# Patient Record
Sex: Male | Born: 1970 | Race: White | Hispanic: No | Marital: Married | Smoking: Current every day smoker
Health system: Southern US, Community
[De-identification: ages and names within clinical notes are randomized; demographics above are authoritative.]

## PROBLEM LIST (undated history)

## (undated) DIAGNOSIS — J45909 Unspecified asthma, uncomplicated: Secondary | ICD-10-CM

---

## 2011-01-18 ENCOUNTER — Emergency Department: Payer: Self-pay | Admitting: *Deleted

## 2012-02-29 ENCOUNTER — Ambulatory Visit: Payer: Self-pay

## 2012-08-10 ENCOUNTER — Ambulatory Visit: Payer: Self-pay | Admitting: Internal Medicine

## 2012-08-10 LAB — CBC WITH DIFFERENTIAL/PLATELET
Basophil %: 0.6 %
Eosinophil %: 0 %
HCT: 50 % (ref 40.0–52.0)
Lymphocyte #: 0.4 10*3/uL — ABNORMAL LOW (ref 1.0–3.6)
Lymphocyte %: 10 %
MCHC: 35 g/dL (ref 32.0–36.0)
MCV: 87 fL (ref 80–100)
Monocyte #: 0.3 x10 3/mm (ref 0.2–1.0)
Monocyte %: 8.1 %
RBC: 5.77 10*6/uL (ref 4.40–5.90)
RDW: 12.9 % (ref 11.5–14.5)
WBC: 3.7 10*3/uL — ABNORMAL LOW (ref 3.8–10.6)

## 2012-08-10 LAB — COMPREHENSIVE METABOLIC PANEL
Albumin: 4.1 g/dL (ref 3.4–5.0)
Anion Gap: 8 (ref 7–16)
Calcium, Total: 8.6 mg/dL (ref 8.5–10.1)
Chloride: 99 mmol/L (ref 98–107)
Co2: 28 mmol/L (ref 21–32)
Glucose: 97 mg/dL (ref 65–99)
Osmolality: 269 (ref 275–301)
SGOT(AST): 45 U/L — ABNORMAL HIGH (ref 15–37)
SGPT (ALT): 52 U/L (ref 12–78)
Sodium: 135 mmol/L — ABNORMAL LOW (ref 136–145)

## 2012-08-10 LAB — URINALYSIS, COMPLETE
Bacteria: NEGATIVE
Ketone: NEGATIVE
Leukocyte Esterase: NEGATIVE
Nitrite: NEGATIVE
Ph: 5 (ref 4.5–8.0)
Specific Gravity: 1.025 (ref 1.003–1.030)

## 2012-08-11 LAB — URINE CULTURE

## 2013-01-27 ENCOUNTER — Ambulatory Visit: Payer: Self-pay | Admitting: Physician Assistant

## 2013-03-31 ENCOUNTER — Ambulatory Visit: Payer: Self-pay | Admitting: Physician Assistant

## 2013-03-31 LAB — RAPID INFLUENZA A&B ANTIGENS

## 2013-03-31 LAB — RAPID STREP-A WITH REFLX: MICRO TEXT REPORT: NEGATIVE

## 2013-04-03 LAB — BETA STREP CULTURE(ARMC)

## 2014-02-10 ENCOUNTER — Ambulatory Visit: Payer: Self-pay | Admitting: Family Medicine

## 2014-07-30 ENCOUNTER — Encounter: Payer: Self-pay | Admitting: *Deleted

## 2014-07-30 ENCOUNTER — Emergency Department
Admission: EM | Admit: 2014-07-30 | Discharge: 2014-07-30 | Disposition: A | Discharge status: Home / Self Care | Payer: BLUE CROSS/BLUE SHIELD | Attending: Emergency Medicine | Admitting: Emergency Medicine

## 2014-07-30 ENCOUNTER — Emergency Department: Payer: BLUE CROSS/BLUE SHIELD

## 2014-07-30 DIAGNOSIS — Z720 Tobacco use: Secondary | ICD-10-CM | POA: Insufficient documentation

## 2014-07-30 DIAGNOSIS — H5330 Unspecified disorder of binocular vision: Secondary | ICD-10-CM

## 2014-07-30 DIAGNOSIS — H538 Other visual disturbances: Secondary | ICD-10-CM | POA: Diagnosis present

## 2014-07-30 DIAGNOSIS — G43909 Migraine, unspecified, not intractable, without status migrainosus: Secondary | ICD-10-CM | POA: Diagnosis not present

## 2014-07-30 DIAGNOSIS — G43009 Migraine without aura, not intractable, without status migrainosus: Secondary | ICD-10-CM

## 2014-07-30 NOTE — ED Notes (Signed)
AAOx3.  skikn warm and dry.  NAD.  Verbalized understnading of instructions.

## 2014-07-30 NOTE — ED Notes (Signed)
Pt states that for the last 2 weeks he has had loss of peripheral vision bilaterally. States it starts out as a line in his vision and progresses to loss of peripheral vision. States that he has had a headache (2/10) afterwards most of the time, but not in most recent episode (this am). Pt states that when he coughs or beds over, he has severe pain in his temples. Pt saw optometrist on Tuesday and was told his eyes are fine and to go to ED if it happened again. He has appt with neurologist on 6/15. Pt eating snacks and in NAD. States he is feeling normal ATT.

## 2014-07-30 NOTE — ED Notes (Signed)
Pt advised of wait, verbalized understanding, no distress noted

## 2014-07-30 NOTE — ED Provider Notes (Signed)
[facility] Emergency Department Provider Note ____________________________________________  Time seen: 1520  I have reviewed the triage vital signs and the nursing notes.  HISTORY  Chief Complaint Eye Problem  HPI Jim Bush is a 44 y.o. male who reports to the ED with complaints of intermittent vision change over the last 2 weeks. He describes an increase in the incidence of intermittent loss of peripheral vision over the last 2 weeks, noting recently the incidents have occurred every other day. He states that the visual disturbance last anywhere from 15-30 minutes. He has also a temporal headache with onset after the visual disturbance has resolved. He denies any nausea, vomiting, vertigo, distal paresthesias, or unilateral lateral weakness with his symptoms. He was recently evaluated by his optometrist, reporting a normal eye exam at the time. He was told to report to the ED if his symptoms recurred. He is here for evaluation and management of symptoms that began today while at work. Due to the ED volume today, his symptoms are now resolved at the time of evaluation. Patient describes waviness and blurriness to his peripheral vision bilaterally, without central vision loss. Denies any specific aura or prodrome before the onset, but does experience some head ache pain at the temples bilaterally most times after the visual disturbance has resolved.  History reviewed. No pertinent past medical history.  There are no active problems to display for this patient.  History reviewed. No pertinent past surgical history.  No current outpatient prescriptions on file.  Allergies Review of patient's allergies indicates no known allergies.  No family history on file.  Social History History  Substance Use Topics  . Smoking status: Current Every Day Smoker  . Smokeless tobacco: Not on file  . Alcohol Use: No   Review of Systems  Constitutional: Negative for  fever, vertigo, or syncope. Eyes: Positive for visual changes. Denies eye pain, discharge, or trauma. ENT: Negative for sore throat. Cardiovascular: Negative for chest pain. Respiratory: Negative for shortness of breath. Gastrointestinal: Negative for abdominal pain, vomiting and diarrhea. Genitourinary: Negative for dysuria. Musculoskeletal: Negative for back pain. Skin: Negative for rash. Neurological: Negative for focal weakness or numbness. Reports headaches as above. ____________________________________________  PHYSICAL EXAM:  VITAL SIGNS: ED Triage Vitals  Enc Vitals Group     BP 07/30/14 0802 126/76 mmHg     Pulse Rate 07/30/14 0802 84     Resp 07/30/14 0802 20     Temp 07/30/14 0802 97.7 F (36.5 C)     Temp Source 07/30/14 0802 Oral     SpO2 07/30/14 0802 100 %     Weight 07/30/14 0802 160 lb (72.576 kg)     Height 07/30/14 0802 6' (1.829 m)     Head Cir --      Peak Flow --      Pain Score 07/30/14 1451 0     Pain Loc --      Pain Edu? --      Excl. in GC? --    Constitutional: Alert and oriented. Well appearing and in no distress. Eyes: Conjunctivae are normal. PERRL. Normal extraocular movements. Normal fundi bilaterally. Normal confrontation and peripheral vision on exam.  ENT   Head: Normocephalic and atraumatic.   Nose: No congestion/rhinnorhea.   Mouth/Throat: Mucous membranes are moist.   Neck: No stridor. Hematological/Lymphatic/Immunilogical: No cervical lymphadenopathy. Cardiovascular: Normal rate, regular rhythm.  Respiratory: Normal respiratory effort.No wheezes/rales/rhonchi. Gastrointestinal: Soft and nontender. No distention. Musculoskeletal: Nontender with normal range of motion in  all extremities.No lower extremity tenderness nor edema. Neurologic:  Normal speech and language. No gross focal neurologic deficits are appreciated. Skin:  Skin is warm, dry and intact. No rash noted. Psychiatric: Mood and affect are normal. Patient  exhibits appropriate insight and judgment. ____________________________________________    CT Head IMPRESSION: Normal head CT ____________________________________________  INITIAL IMPRESSION / ASSESSMENT AND PLAN / ED COURSE  Intermittent visual disturbance without injury, trauma, or pain. Radiology results and normal exam results to the patient and family. Consider atypical migraine or headache presentation as possible etiology. Patient to follow-up with neurology and ophthamologist for further evaluation.  ____________________________________________  FINAL CLINICAL IMPRESSION(S) / ED DIAGNOSES  Final diagnoses:  Binocular visual disturbance  Atypical migraine     Lissa HoardJenise V Bacon Yesmin Mutch, PA-C 07/30/14 1903  Governor Rooksebecca Lord, MD 07/31/14 [phone]

## 2014-07-30 NOTE — Discharge Instructions (Signed)
Blurred Vision You have been seen today complaining of blurred vision. This means you have a loss of ability to see small details.  CAUSES  Blurred vision can be a symptom of underlying eye problems, such as:  Aging of the eye (presbyopia).  Glaucoma.  Cataracts.  Eye infection.  Eye-related migraine.  Diabetes mellitus.  Fatigue.  Migraine headaches.  High blood pressure.  Breakdown of the back of the eye (macular degeneration).  Problems caused by some medications. The most common cause of blurred vision is the need for eyeglasses or a new prescription. Today in the emergency department, no cause for your blurred vision can be found. SYMPTOMS  Blurred vision is the loss of visual sharpness and detail (acuity). DIAGNOSIS  Should blurred vision continue, you should see your caregiver. If your caregiver is your primary care physician, he or she may choose to refer you to another specialist.  TREATMENT  Do not ignore your blurred vision. Make sure to have it checked out to see if further treatment or referral is necessary. SEEK MEDICAL CARE IF:  You are unable to get into a specialist so we can help you with a referral. SEEK IMMEDIATE MEDICAL CARE IF: You have severe eye pain, severe headache, or sudden loss of vision. MAKE SURE YOU:   Understand these instructions.  Will watch your condition.  Will get help right away if you are not doing well or get worse. Document Released: 02/15/2003 Document Revised: 05/07/2011 Document Reviewed: 09/17/2007 [facility] Patient Information 2015 [place], [place]. This information is not intended to replace advice given to you by your health care provider. Make sure you discuss any questions you have with your health care provider.  Your exam and CT scan are normal today.  Continue to monitor  Your symptoms, return to the ED or see your neurology provider as scheduled.  Your symptoms may represent an atypical form of headache or migraine.  Take Advil and Benadryl as needed for headache relief.

## 2015-04-30 ENCOUNTER — Ambulatory Visit
Admission: EM | Admit: 2015-04-30 | Discharge: 2015-04-30 | Disposition: A | Discharge status: Home / Self Care | Payer: BLUE CROSS/BLUE SHIELD | Attending: Family Medicine | Admitting: Family Medicine

## 2015-04-30 DIAGNOSIS — J069 Acute upper respiratory infection, unspecified: Secondary | ICD-10-CM

## 2015-04-30 HISTORY — DX: Unspecified asthma, uncomplicated: J45.909

## 2015-04-30 MED ORDER — AZITHROMYCIN 250 MG PO TABS: ORAL_TABLET | ORAL | Status: AC

## 2015-04-30 MED ORDER — AZITHROMYCIN 250 MG PO TABS: ORAL_TABLET | ORAL | Status: DC

## 2015-04-30 NOTE — Discharge Instructions (Signed)
Try Delysm for cough, Claritin for any allergy symptoms, stop smoking.

## 2015-04-30 NOTE — ED Provider Notes (Signed)
CSN: [id number]     Arrival date & time 04/30/15  1523 History   None    Chief Complaint  Patient presents with  . Cough    One week hx of congestion, cough, ST. No fever. Green phlegm.    (Consider location/radiation/quality/duration/timing/severity/associated sxs/prior Treatment) HPI: Patient presents today with symptoms of nasal congestion, mild productive cough for the last week. Patient states that the mucus is green in color. He denies any chest pain, shortness of breath, nausea, vomiting, abdominal pain, headache. He does smoke. He denies any history of COPD. He does state that he has asthma and does use his albuterol inhaler as needed. He has been using it once or twice daily.  No past medical history on file. No past surgical history on file. No family history on file. Social History  Substance Use Topics  . Smoking status: Current Every Day Smoker  . Smokeless tobacco: Not on file  . Alcohol Use: No    Review of Systems: Negative except mentioned above.   Allergies  Review of patient's allergies indicates no known allergies.  Home Medications   Prior to Admission medications   Not on File   Meds Ordered and Administered this Visit  Medications - No data to display  There were no vitals taken for this visit. No data found.   Physical Exam  ROS: Negative except mentioned above.  GENERAL: NAD HEENT: mild pharyngeal erythema, no exudate, no erythema of TMs, no cervical LAD RESP: CTA B CARD: RRR NEURO: CN II-XII grossly intact   ED Course  Procedures (including critical care time)  Labs Review Labs Reviewed - No data to display  Imaging Review No results found.    MDM  A/P: URI- will treat with Z-Pak, Delsym when necessary, Claritin when necessary, albuterol when necessary, rest, hydration, seek medical attention if symptoms persist or worsen. Patient is to continue taking his albuterol inhaler as needed. I've encourage patient on smoking  cessation.    Jolene ProvostKirtida Kahne Helfand, MD 04/30/15 [phone]

## 2016-03-17 IMAGING — CT CT HEAD W/O CM
1 series · 16 of 30 positions shown, 20 images · non-contrast
Comparison: None.

CLINICAL DATA: Intermittent tunnel vision over the last few weeks.
Normal ophthalmological eye exam. Episodes of headache.

EXAM:
CT HEAD WITHOUT CONTRAST
TECHNIQUE: Contiguous axial images were obtained from the base of the skull
through the vertex without intravenous contrast.

[Series 2: soft tissue · axial · 0.42mm/px · z∈[-160,-25]mm · 16 of 30 slices shown, 20 images]
[im 2/30  brain]
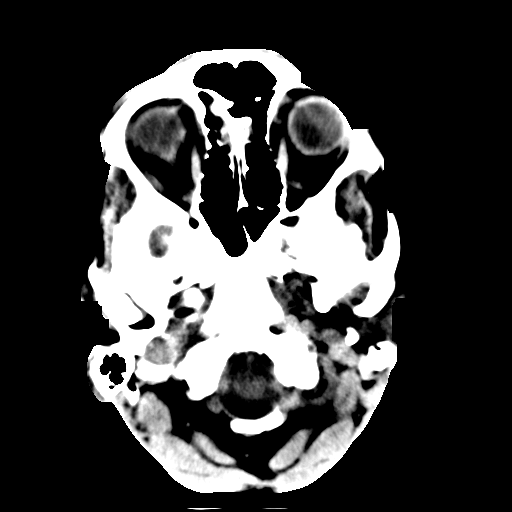
[im 2/30  bone]
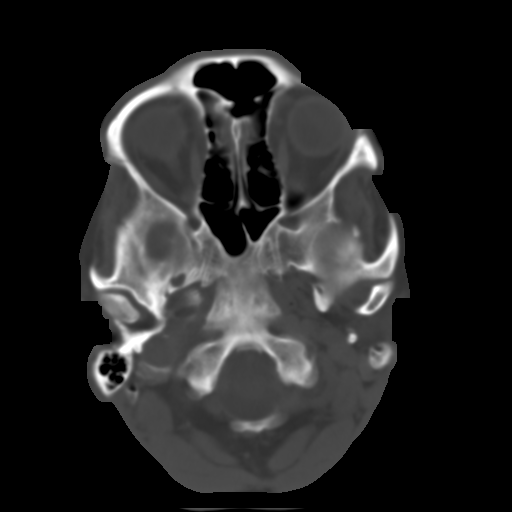
[im 4/30  brain]
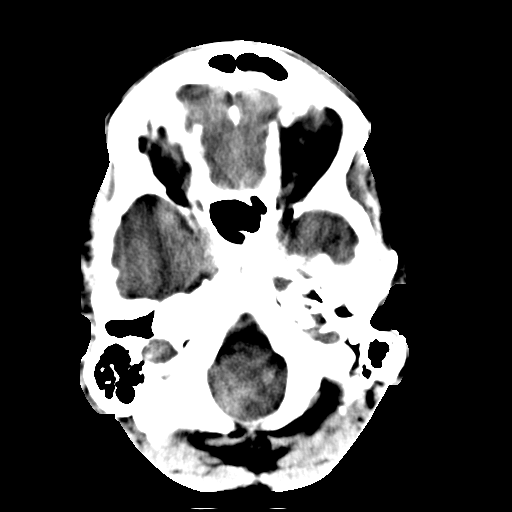
[im 6/30  brain]
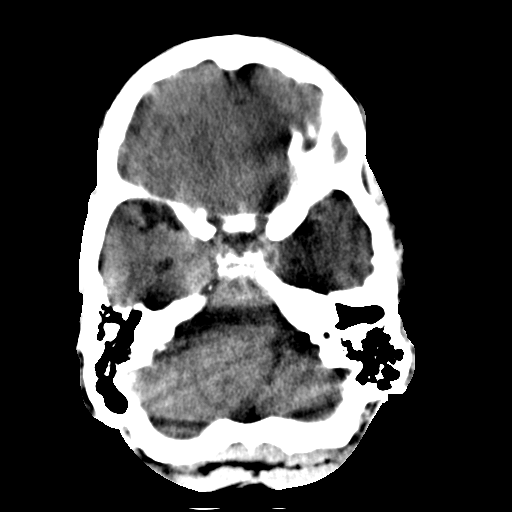
[im 8/30  brain]
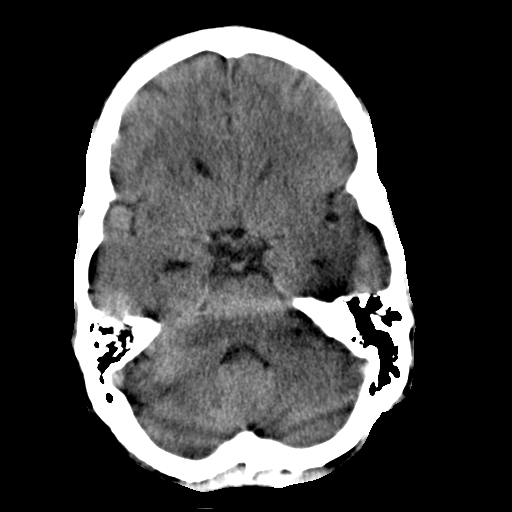
[im 9/30  brain]
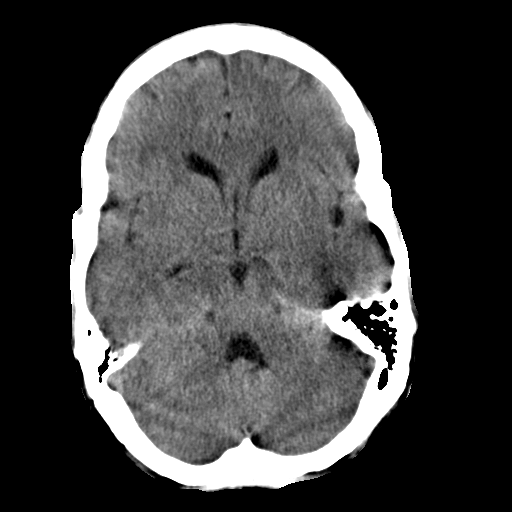
[im 9/30  bone]
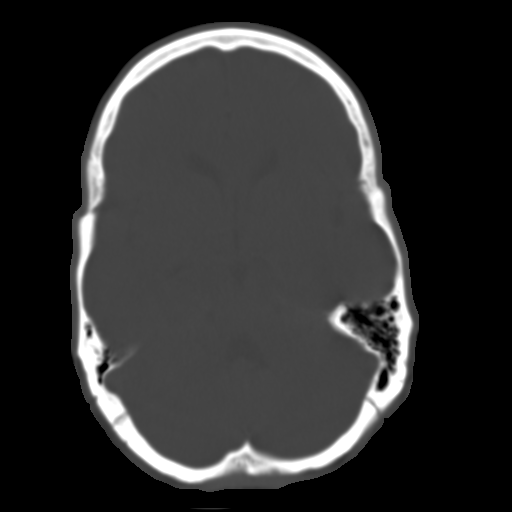
[im 11/30  brain]
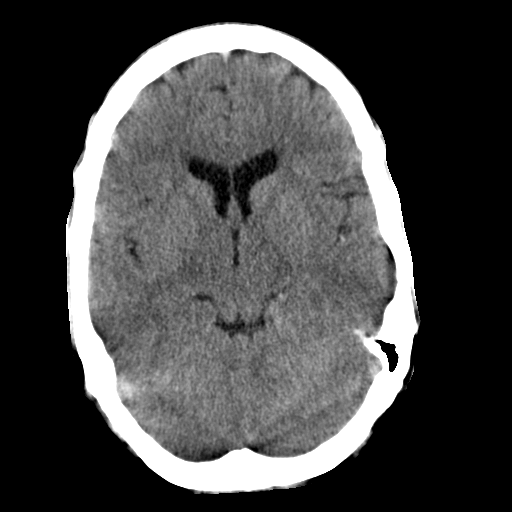
[im 13/30  brain]
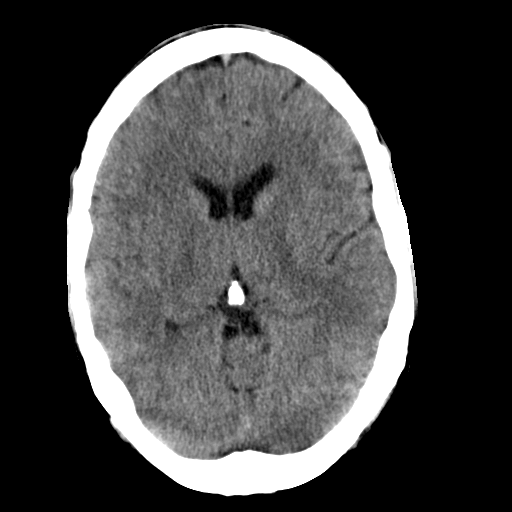
[im 15/30  brain]
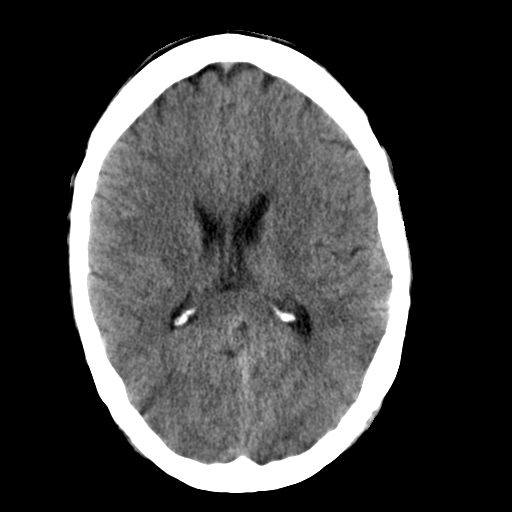
[im 16/30  brain]
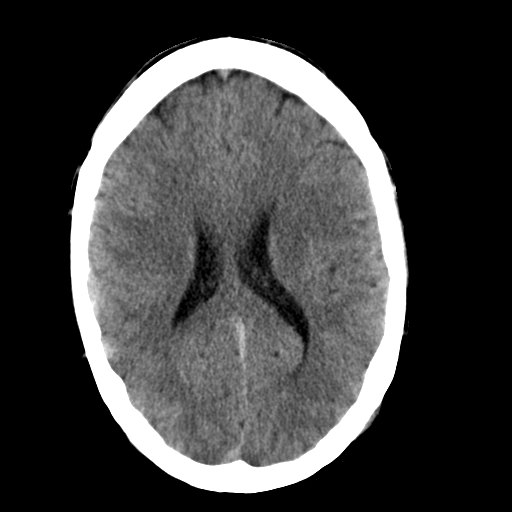
[im 16/30  bone]
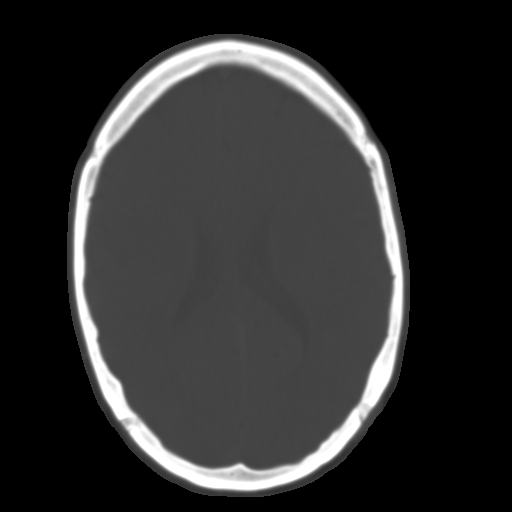
[im 18/30  brain]
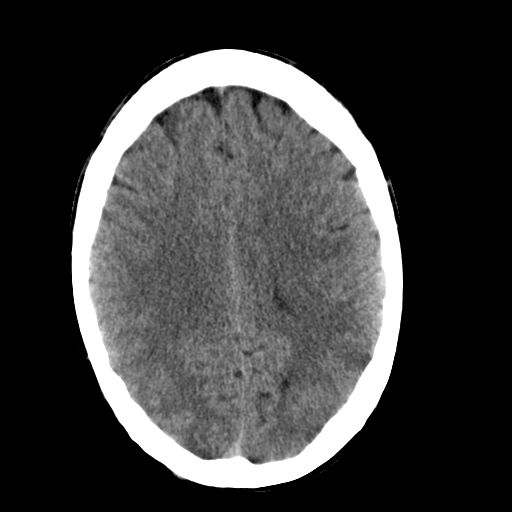
[im 20/30  brain]
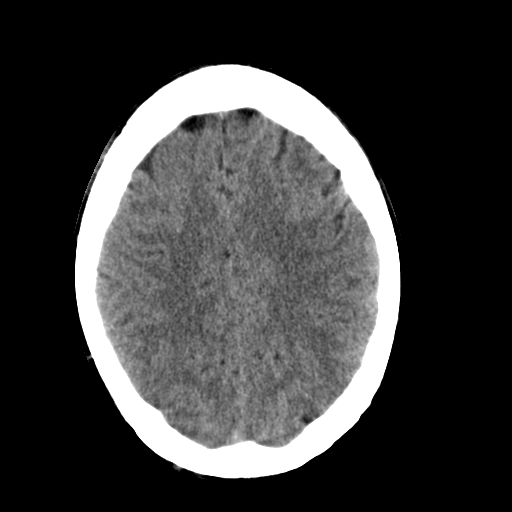
[im 22/30  brain]
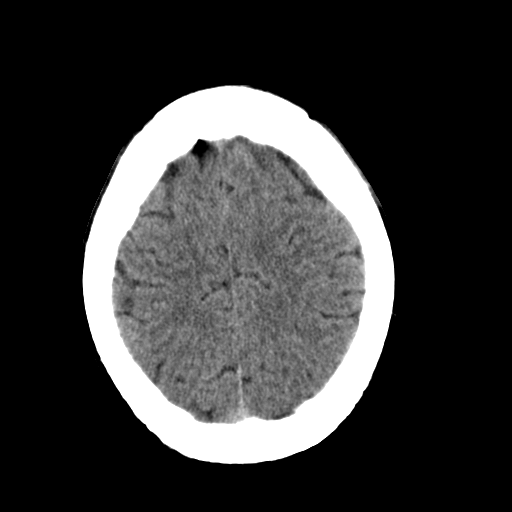
[im 23/30  brain]
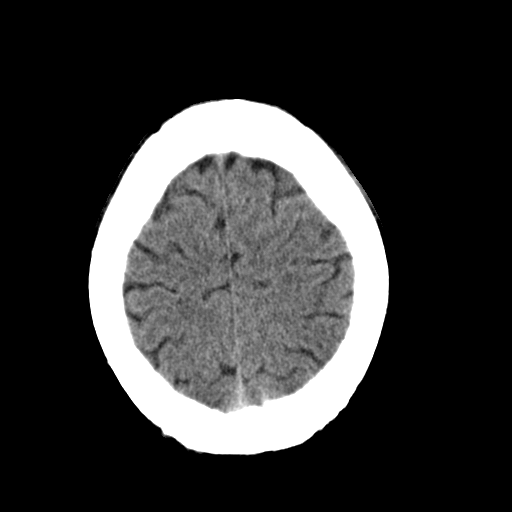
[im 23/30  bone]
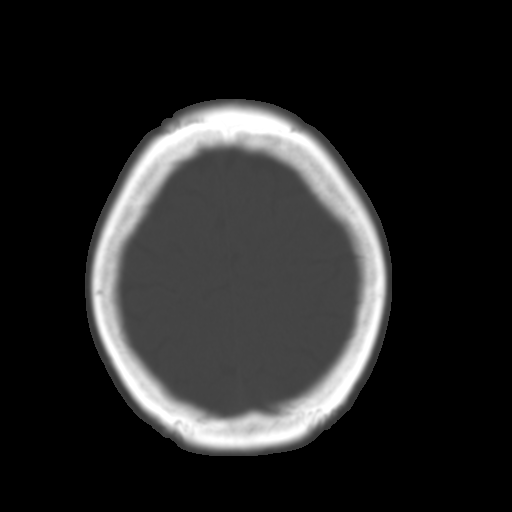
[im 25/30  brain]
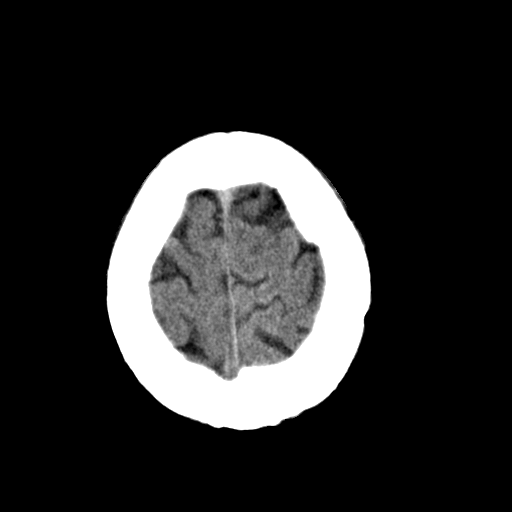
[im 27/30  brain]
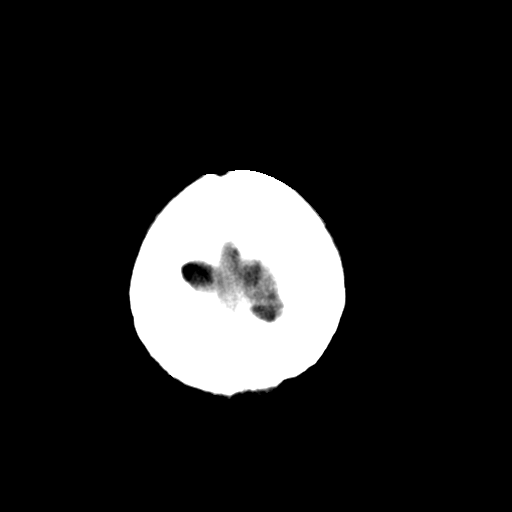
[im 29/30  brain]
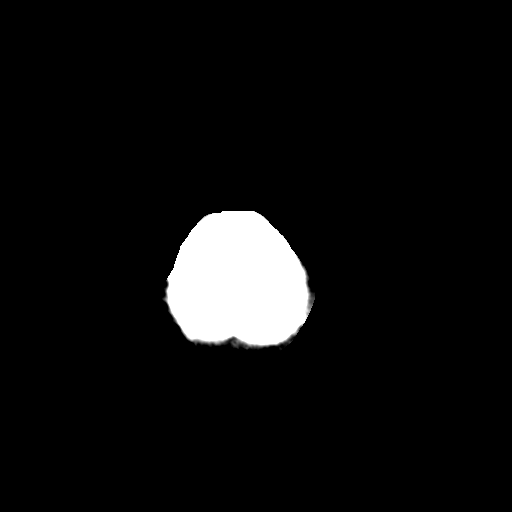

[16 of 30 positions shown; findings below may reference images not displayed]

FINDINGS: The brain has a normal appearance without evidence of atrophy,
infarction, mass lesion, hemorrhage, hydrocephalus or extra-axial
collection. The calvarium is unremarkable. The paranasal sinuses,
middle ears and mastoids are clear.
IMPRESSION: Normal head CT
